# Patient Record
Sex: Female | Born: 1950 | Race: White | Hispanic: No | Marital: Single | Smoking: Never smoker
Health system: Southern US, Community
[De-identification: ages and names within clinical notes are randomized; demographics above are authoritative.]

## PROBLEM LIST (undated history)

## (undated) DIAGNOSIS — E669 Obesity, unspecified: Secondary | ICD-10-CM

## (undated) DIAGNOSIS — E1169 Type 2 diabetes mellitus with other specified complication: Secondary | ICD-10-CM

## (undated) DIAGNOSIS — I10 Essential (primary) hypertension: Secondary | ICD-10-CM

## (undated) HISTORY — DX: Essential (primary) hypertension: I10

## (undated) HISTORY — PX: BREAST BIOPSY: SHX20

## (undated) HISTORY — DX: Obesity, unspecified: E66.9

## (undated) HISTORY — PX: BREAST EXCISIONAL BIOPSY: SUR124

## (undated) HISTORY — DX: Type 2 diabetes mellitus with other specified complication: E11.69

## (undated) HISTORY — DX: Morbid (severe) obesity due to excess calories: E66.01

---

## 2019-02-18 ENCOUNTER — Ambulatory Visit: Payer: Self-pay

## 2019-02-23 ENCOUNTER — Ambulatory Visit: Payer: Self-pay

## 2019-02-25 ENCOUNTER — Encounter: Payer: Self-pay | Admitting: Physician Assistant

## 2019-02-25 ENCOUNTER — Other Ambulatory Visit: Payer: Self-pay | Admitting: Physician Assistant

## 2019-02-25 DIAGNOSIS — U071 COVID-19: Secondary | ICD-10-CM

## 2019-02-25 DIAGNOSIS — I10 Essential (primary) hypertension: Secondary | ICD-10-CM

## 2019-02-25 NOTE — Progress Notes (Signed)
  I connected by phone with Kathy Allen on 02/25/2019 at 4:46 PM to discuss the potential use of an new treatment for mild to moderate COVID-19 viral infection in non-hospitalized patients.  This patient is a 69 y.o. female that meets the FDA criteria for Emergency Use Authorization of bamlanivimab or casirivimab\imdevimab.  Has a (+) direct SARS-CoV-2 viral test result  Has mild or moderate COVID-19   Is ? 70 years of age and weighs ? 40 kg  Is NOT hospitalized due to COVID-19  Is NOT requiring oxygen therapy or requiring an increase in baseline oxygen flow rate due to COVID-19  Is within 10 days of symptom onset  Has at least one of the high risk factor(s) for progression to severe COVID-19 and/or hospitalization as defined in EUA.  Specific high risk criteria : >/= 70 yo   I have spoken and communicated the following to the patient or parent/caregiver:  1. FDA has authorized the emergency use of bamlanivimab and casirivimab\imdevimab for the treatment of mild to moderate COVID-19 in adults and pediatric patients with positive results of direct SARS-CoV-2 viral testing who are 47 years of age and older weighing at least 40 kg, and who are at high risk for progressing to severe COVID-19 and/or hospitalization.  2. The significant known and potential risks and benefits of bamlanivimab and casirivimab\imdevimab, and the extent to which such potential risks and benefits are unknown.  3. Information on available alternative treatments and the risks and benefits of those alternatives, including clinical trials.  4. Patients treated with bamlanivimab and casirivimab\imdevimab should continue to self-isolate and use infection control measures (e.g., wear mask, isolate, social distance, avoid sharing personal items, clean and disinfect "high touch" surfaces, and frequent handwashing) according to CDC guidelines.   5. The patient or parent/caregiver has the option to accept or refuse  bamlanivimab or casirivimab\imdevimab .  After reviewing this information with the patient, The patient agreed to proceed with receiving the casirivimab\imdevimab infusion and will be provided a copy of the Fact sheet prior to receiving the infusion.   Patient is set up for 02/26/19 @ 8:30am. Directions given. Sx onset 2/4. Was scheduled for vaccine tomorrow. She is aware that she needs to wait 90 days.   Angelena Form PA-C 02/25/2019 4:46 PM    2

## 2019-02-26 ENCOUNTER — Ambulatory Visit (HOSPITAL_COMMUNITY)
Admission: RE | Admit: 2019-02-26 | Discharge: 2019-02-26 | Disposition: A | Discharge status: Home / Self Care | Payer: Medicare Other | Source: Ambulatory Visit | Attending: Pulmonary Disease | Admitting: Pulmonary Disease

## 2019-02-26 ENCOUNTER — Ambulatory Visit: Payer: Self-pay

## 2019-02-26 DIAGNOSIS — U071 COVID-19: Secondary | ICD-10-CM | POA: Insufficient documentation

## 2019-02-26 DIAGNOSIS — Z23 Encounter for immunization: Secondary | ICD-10-CM | POA: Insufficient documentation

## 2019-02-26 DIAGNOSIS — I10 Essential (primary) hypertension: Secondary | ICD-10-CM | POA: Insufficient documentation

## 2019-02-26 MED ORDER — SODIUM CHLORIDE 0.9 % IV SOLN
Freq: Once | INTRAVENOUS | Status: AC
  Filled 2019-02-26: qty 10

## 2019-02-26 MED ORDER — EPINEPHRINE 0.3 MG/0.3ML IJ SOAJ: 0.3000 mg | Freq: Once | INTRAMUSCULAR | Status: DC | PRN

## 2019-02-26 MED ORDER — FAMOTIDINE IN NACL 20-0.9 MG/50ML-% IV SOLN: 20.0000 mg | Freq: Once | INTRAVENOUS | Status: DC | PRN

## 2019-02-26 MED ORDER — METHYLPREDNISOLONE SODIUM SUCC 125 MG IJ SOLR: 125.0000 mg | Freq: Once | INTRAMUSCULAR | Status: DC | PRN

## 2019-02-26 MED ORDER — ALBUTEROL SULFATE HFA 108 (90 BASE) MCG/ACT IN AERS: 2.0000 | INHALATION_SPRAY | Freq: Once | RESPIRATORY_TRACT | Status: DC | PRN

## 2019-02-26 MED ORDER — DIPHENHYDRAMINE HCL 50 MG/ML IJ SOLN: 50.0000 mg | Freq: Once | INTRAMUSCULAR | Status: DC | PRN

## 2019-02-26 MED ORDER — SODIUM CHLORIDE 0.9 % IV SOLN
INTRAVENOUS | Status: DC | PRN
  Administered 2019-02-26: 250 mL via INTRAVENOUS

## 2019-02-26 NOTE — Progress Notes (Signed)
  Diagnosis: COVID-19  Physician: Dr. Joya Gaskins  Procedure: [facility] Med: casirivimab\imdevimab infusion - Provided patient with casirivimab\imdevimab fact sheet for patients, parents and caregivers prior to infusion.  Complications: No immediate complications noted.  Discharge: Discharged home   Tai, Hermelinda Medicus 02/26/2019

## 2019-02-26 NOTE — Discharge Instructions (Signed)
COVID-19 COVID-19 is a respiratory infection that is caused by a virus called severe acute respiratory syndrome coronavirus 2 (SARS-CoV-2). The disease is also known as coronavirus disease or novel coronavirus. In some people, the virus may not cause any symptoms. In others, it may cause a serious infection. The infection can get worse quickly and can lead to complications, such as:  Pneumonia, or infection of the lungs.  Acute respiratory distress syndrome or ARDS. This is a condition in which fluid build-up in the lungs prevents the lungs from filling with air and passing oxygen into the blood.  Acute respiratory failure. This is a condition in which there is not enough oxygen passing from the lungs to the body or when carbon dioxide is not passing from the lungs out of the body.  Sepsis or septic shock. This is a serious bodily reaction to an infection.  Blood clotting problems.  Secondary infections due to bacteria or fungus.  Organ failure. This is when your body's organs stop working. The virus that causes COVID-19 is contagious. This means that it can spread from person to person through droplets from coughs and sneezes (respiratory secretions). What are the causes? This illness is caused by a virus. You may catch the virus by:  Breathing in droplets from an infected person. Droplets can be spread by a person breathing, speaking, singing, coughing, or sneezing.  Touching something, like a table or a doorknob, that was exposed to the virus (contaminated) and then touching your mouth, nose, or eyes. What increases the risk? Risk for infection You are more likely to be infected with this virus if you:  Are within 6 feet (2 meters) of a person with COVID-19.  Provide care for or live with a person who is infected with COVID-19.  Spend time in crowded indoor spaces or live in shared housing. Risk for serious illness You are more likely to become seriously ill from the virus if  you:  Are 50 years of age or older. The higher your age, the more you are at risk for serious illness.  Live in a nursing home or long-term care facility.  Have cancer.  Have a long-term (chronic) disease such as: ? Chronic lung disease, including chronic obstructive pulmonary disease or asthma. ? A long-term disease that lowers your body's ability to fight infection (immunocompromised). ? Heart disease, including heart failure, a condition in which the arteries that lead to the heart become narrow or blocked (coronary artery disease), a disease which makes the heart muscle thick, weak, or stiff (cardiomyopathy). ? Diabetes. ? Chronic kidney disease. ? Sickle cell disease, a condition in which red blood cells have an abnormal "sickle" shape. ? Liver disease.  Are obese. What are the signs or symptoms? Symptoms of this condition can range from mild to severe. Symptoms may appear any time from 2 to 14 days after being exposed to the virus. They include:  A fever or chills.  A cough.  Difficulty breathing.  Headaches, body aches, or muscle aches.  Runny or stuffy (congested) nose.  A sore throat.  New loss of taste or smell. Some people may also have stomach problems, such as nausea, vomiting, or diarrhea. Other people may not have any symptoms of COVID-19. How is this diagnosed? This condition may be diagnosed based on:  Your signs and symptoms, especially if: ? You live in an area with a COVID-19 outbreak. ? You recently traveled to or from an area where the virus is common. ? You   provide care for or live with a person who was diagnosed with COVID-19. ? You were exposed to a person who was diagnosed with COVID-19.  A physical exam.  Lab tests, which may include: ? Taking a sample of fluid from the back of your nose and throat (nasopharyngeal fluid), your nose, or your throat using a swab. ? A sample of mucus from your lungs (sputum). ? Blood tests.  Imaging tests,  which may include, X-rays, CT scan, or ultrasound. How is this treated? At present, there is no medicine to treat COVID-19. Medicines that treat other diseases are being used on a trial basis to see if they are effective against COVID-19. Your health care provider will talk with you about ways to treat your symptoms. For most people, the infection is mild and can be managed at home with rest, fluids, and over-the-counter medicines. Treatment for a serious infection usually takes places in a hospital intensive care unit (ICU). It may include one or more of the following treatments. These treatments are given until your symptoms improve.  Receiving fluids and medicines through an IV.  Supplemental oxygen. Extra oxygen is given through a tube in the nose, a face mask, or a hood.  Positioning you to lie on your stomach (prone position). This makes it easier for oxygen to get into the lungs.  Continuous positive airway pressure (CPAP) or bi-level positive airway pressure (BPAP) machine. This treatment uses mild air pressure to keep the airways open. A tube that is connected to a motor delivers oxygen to the body.  Ventilator. This treatment moves air into and out of the lungs by using a tube that is placed in your windpipe.  Tracheostomy. This is a procedure to create a hole in the neck so that a breathing tube can be inserted.  Extracorporeal membrane oxygenation (ECMO). This procedure gives the lungs a chance to recover by taking over the functions of the heart and lungs. It supplies oxygen to the body and removes carbon dioxide. Follow these instructions at home: Lifestyle  If you are sick, stay home except to get medical care. Your health care provider will tell you how long to stay home. Call your health care provider before you go for medical care.  Rest at home as told by your health care provider.  Do not use any products that contain nicotine or tobacco, such as cigarettes,  e-cigarettes, and chewing tobacco. If you need help quitting, ask your health care provider.  Return to your normal activities as told by your health care provider. Ask your health care provider what activities are safe for you. General instructions  Take over-the-counter and prescription medicines only as told by your health care provider.  Drink enough fluid to keep your urine pale yellow.  Keep all follow-up visits as told by your health care provider. This is important. How is this prevented?  There is no vaccine to help prevent COVID-19 infection. However, there are steps you can take to protect yourself and others from this virus. To protect yourself:   Do not travel to areas where COVID-19 is a risk. The areas where COVID-19 is reported change often. To identify high-risk areas and travel restrictions, check the CDC travel website: wwwnc.cdc.gov/travel/notices  If you live in, or must travel to, an area where COVID-19 is a risk, take precautions to avoid infection. ? Stay away from people who are sick. ? Wash your hands often with soap and water for 20 seconds. If soap and water   are not available, use an alcohol-based hand sanitizer. ? Avoid touching your mouth, face, eyes, or nose. ? Avoid going out in public, follow guidance from your state and local health authorities. ? If you must go out in public, wear a cloth face covering or face mask. Make sure your mask covers your nose and mouth. ? Avoid crowded indoor spaces. Stay at least 6 feet (2 meters) away from others. ? Disinfect objects and surfaces that are frequently touched every day. This may include:  Counters and tables.  Doorknobs and light switches.  Sinks and faucets.  Electronics, such as phones, remote controls, keyboards, computers, and tablets. To protect others: If you have symptoms of COVID-19, take steps to prevent the virus from spreading to others.  If you think you have a COVID-19 infection, contact  your health care provider right away. Tell your health care team that you think you may have a COVID-19 infection.  Stay home. Leave your house only to seek medical care. Do not use public transport.  Do not travel while you are sick.  Wash your hands often with soap and water for 20 seconds. If soap and water are not available, use alcohol-based hand sanitizer.  Stay away from other members of your household. Let healthy household members care for children and pets, if possible. If you have to care for children or pets, wash your hands often and wear a mask. If possible, stay in your own room, separate from others. Use a different bathroom.  Make sure that all people in your household wash their hands well and often.  Cough or sneeze into a tissue or your sleeve or elbow. Do not cough or sneeze into your hand or into the air.  Wear a cloth face covering or face mask. Make sure your mask covers your nose and mouth. Where to find more information  Centers for Disease Control and Prevention: www.cdc.gov/coronavirus/2019-ncov/index.html  World Health Organization: www.who.int/health-topics/coronavirus Contact a health care provider if:  You live in or have traveled to an area where COVID-19 is a risk and you have symptoms of the infection.  You have had contact with someone who has COVID-19 and you have symptoms of the infection. Get help right away if:  You have trouble breathing.  You have pain or pressure in your chest.  You have confusion.  You have bluish lips and fingernails.  You have difficulty waking from sleep.  You have symptoms that get worse. These symptoms may represent a serious problem that is an emergency. Do not wait to see if the symptoms will go away. Get medical help right away. Call your local emergency services (911 in the [place] Do not drive yourself to the hospital. Let the emergency medical personnel know if you think you have  COVID-19. Summary  COVID-19 is a respiratory infection that is caused by a virus. It is also known as coronavirus disease or novel coronavirus. It can cause serious infections, such as pneumonia, acute respiratory distress syndrome, acute respiratory failure, or sepsis.  The virus that causes COVID-19 is contagious. This means that it can spread from person to person through droplets from breathing, speaking, singing, coughing, or sneezing.  You are more likely to develop a serious illness if you are 50 years of age or older, have a weak immune system, live in a nursing home, or have chronic disease.  There is no medicine to treat COVID-19. Your health care provider will talk with you about ways to treat your symptoms.    Take steps to protect yourself and others from infection. Wash your hands often and disinfect objects and surfaces that are frequently touched every day. Stay away from people who are sick and wear a mask if you are sick. This information is not intended to replace advice given to you by your health care provider. Make sure you discuss any questions you have with your health care provider. Document Revised: 11/05/2018 Document Reviewed: 02/11/2018 Elsevier Patient Education  2020 Elsevier Inc. What types of side effects do monoclonal antibody drugs cause?  Common side effects  In general, the more common side effects caused by monoclonal antibody drugs include: . Allergic reactions, such as hives or itching . Flu-like signs and symptoms, including chills, fatigue, fever, and muscle aches and pains . Nausea, vomiting . Diarrhea . Skin rashes . Low blood pressure   The CDC is recommending patients who receive monoclonal antibody treatments wait at least 90 days before being vaccinated.  Currently, there are no data on the safety and efficacy of mRNA COVID-19 vaccines in persons who received monoclonal antibodies or convalescent plasma as part of COVID-19 treatment. Based  on the estimated half-life of such therapies as well as evidence suggesting that reinfection is uncommon in the 90 days after initial infection, vaccination should be deferred for at least 90 days, as a precautionary measure until additional information becomes available, to avoid interference of the antibody treatment with vaccine-induced immune responses. 

## 2019-05-17 ENCOUNTER — Encounter: Payer: Self-pay | Admitting: Cardiovascular Disease

## 2019-05-24 ENCOUNTER — Ambulatory Visit (INDEPENDENT_AMBULATORY_CARE_PROVIDER_SITE_OTHER): Payer: Medicare Other | Admitting: Cardiovascular Disease

## 2019-05-24 ENCOUNTER — Encounter: Payer: Self-pay | Admitting: Cardiovascular Disease

## 2019-05-24 ENCOUNTER — Other Ambulatory Visit: Payer: Self-pay

## 2019-05-24 VITALS — BP 124/78 | HR 76 | Ht 64.0 in | Wt 202.0 lb

## 2019-05-24 DIAGNOSIS — I10 Essential (primary) hypertension: Secondary | ICD-10-CM

## 2019-05-24 DIAGNOSIS — E782 Mixed hyperlipidemia: Secondary | ICD-10-CM | POA: Diagnosis not present

## 2019-05-24 DIAGNOSIS — R5383 Other fatigue: Secondary | ICD-10-CM | POA: Diagnosis not present

## 2019-05-24 DIAGNOSIS — E785 Hyperlipidemia, unspecified: Secondary | ICD-10-CM | POA: Insufficient documentation

## 2019-05-24 NOTE — Progress Notes (Signed)
05/24/2019 Kathy Allen   05-Aug-1950  [id number]  Primary Physician Glenis Smoker, MD Primary Cardiologist: Lorretta Harp MD FACP, [place], [place], [place]  HPI:  Kathy Allen is a 69 y.o. moderately overweight single Caucasian female with no children referred by Dr. Lindell Noe because of fatigue.  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  Her cardiovascular risk factor profile is notable for treated hypertension hyperlipidemia.  There is no family history for heart disease.  She is never had a heart attack or stroke.  She denies chest pain or shortness of breath.  She did have COVID-19 back in early February of this year and had Regeneron infusion a day after the diagnosis.  Since that time she is felt intermittently fatigued for no reason.   Current Meds  Medication Sig  . amitriptyline (ELAVIL) 10 MG tablet Take 3 tablets by mouth daily.  . famotidine (PEPCID) 10 MG tablet Take 10 mg by mouth daily.  . metFORMIN (GLUCOPHAGE) 500 MG tablet Take 1,000 mg by mouth 2 (two) times daily with a meal.  . metoprolol succinate (TOPROL-XL) 50 MG 24 hr tablet Take 50 mg by mouth daily.  . simvastatin (ZOCOR) 20 MG tablet Take 20 mg by mouth daily.  Marland Kitchen topiramate (TOPAMAX) 50 MG tablet Take 50 mg by mouth 2 (two) times daily.     Allergies  Allergen Reactions  . Sulfur   . Tagamet [Cimetidine]     Social History   Socioeconomic History  . Marital status: Unknown    Spouse name: Not on file  . Number of children: Not on file  . Years of education: Not on file  . Highest education level: Not on file  Occupational History  . Not on file  Tobacco Use  . Smoking status: Never Smoker  . Smokeless tobacco: Never Used  Substance and Sexual Activity  . Alcohol use: Not on file  . Drug use: Not on file  . Sexual activity: Not on file  Other  Topics Concern  . Not on file  Social History Narrative  . Not on file   Social Determinants of Health   Financial Resource Strain:   . Difficulty of Paying Living Expenses:   Food Insecurity:   . Worried About Charity fundraiser in the Last Year:   . Arboriculturist in the Last Year:   Transportation Needs:   . Film/video editor (Medical):   Marland Kitchen Lack of Transportation (Non-Medical):   Physical Activity:   . Days of Exercise per Week:   . Minutes of Exercise per Session:   Stress:   . Feeling of Stress :   Social Connections:   . Frequency of Communication with Friends and Family:   . Frequency of Social Gatherings with Friends and Family:   . Attends Religious Services:   . Active Member of Clubs or Organizations:   . Attends Archivist Meetings:   Marland Kitchen Marital Status:   Intimate Partner Violence:   . Fear of Current or Ex-Partner:   . Emotionally Abused:   Marland Kitchen Physically Abused:   . Sexually Abused:      Review of Systems: General: negative for chills, fever, night sweats or weight changes.  Cardiovascular: negative for chest pain, dyspnea on exertion, edema, orthopnea, palpitations, paroxysmal nocturnal dyspnea or shortness of breath Dermatological: negative for rash Respiratory:  negative for cough or wheezing Urologic: negative for hematuria Abdominal: negative for nausea, vomiting, diarrhea, bright red blood per rectum, melena, or hematemesis Neurologic: negative for visual changes, syncope, or dizziness All other systems reviewed and are otherwise negative except as noted above.    Blood pressure 124/78, pulse 76, height 5\' 4"  (1.626 m), weight 202 lb (91.6 kg), SpO2 96 %.  General appearance: alert and no distress Neck: no adenopathy, no carotid bruit, no JVD, supple, symmetrical, trachea midline and thyroid not enlarged, symmetric, no tenderness/mass/nodules Lungs: clear to auscultation bilaterally Heart: regular rate and rhythm, S1, S2 normal, no  murmur, click, rub or gallop Extremities: extremities normal, atraumatic, no cyanosis or edema Pulses: 2+ and symmetric Skin: Skin color, texture, turgor normal. No rashes or lesions Neurologic: Alert and oriented X 3, normal strength and tone. Normal symmetric reflexes. Normal coordination and gait  EKG not performed today  ASSESSMENT AND PLAN:   Hyperlipidemia History of hyperlipidemia on statin therapy with lipid profile performed by her PCP 03/14/2019 revealing a total cholesterol 164, LDL of 88 and HDL of 60.  Essential hypertension History of essential potential blood pressure measured today of 124/78.  She is on metoprolol.      Lorretta Harp MD FACP,FACC,FAHA, [facility] 05/24/2019 2:51 PM

## 2019-05-24 NOTE — Assessment & Plan Note (Signed)
History of hyperlipidemia on statin therapy with lipid profile performed by her PCP 03/14/2019 revealing a total cholesterol 164, LDL of 88 and HDL of 60.

## 2019-05-24 NOTE — Patient Instructions (Signed)
Medication Instructions:  NO CHANGE *If you need a refill on your cardiac medications before your next appointment, please call your pharmacy*   Lab Work: If you have labs (blood work) drawn today and your tests are completely normal, you will receive your results only by: . MyChart Message (if you have MyChart) OR . A paper copy in the mail If you have any lab test that is abnormal or we need to change your treatment, we will call you to review the results.   Testing/Procedures: Your physician has requested that you have an echocardiogram. Echocardiography is a painless test that uses sound waves to create images of your heart. It provides your doctor with information about the size and shape of your heart and how well your heart's chambers and valves are working. This procedure takes approximately one hour. There are no restrictions for this procedure.[address]   Follow-Up: At [facility], you and your health needs are our priority.  As part of our continuing mission to provide you with exceptional heart care, we have created designated Provider Care Teams.  These Care Teams include your primary Cardiologist (physician) and Advanced Practice Providers (APPs -  Physician Assistants and Nurse Practitioners) who all work together to provide you with the care you need, when you need it.  We recommend signing up for the patient portal called "MyChart".  Sign up information is provided on this After Visit Summary.  MyChart is used to connect with patients for Virtual Visits (Telemedicine).  Patients are able to view lab/test results, encounter notes, upcoming appointments, etc.  Non-urgent messages can be sent to your provider as well.   To learn more about what you can do with MyChart, go to [email or web address].    Your next appointment:    AS NEEDED 

## 2019-05-24 NOTE — Assessment & Plan Note (Signed)
History of essential potential blood pressure measured today of 124/78.  She is on metoprolol.

## 2019-05-27 ENCOUNTER — Ambulatory Visit (HOSPITAL_COMMUNITY): Payer: Medicare Other | Attending: Cardiology

## 2019-05-27 ENCOUNTER — Other Ambulatory Visit: Payer: Self-pay

## 2019-05-27 DIAGNOSIS — R5383 Other fatigue: Secondary | ICD-10-CM

## 2019-05-27 DIAGNOSIS — I080 Rheumatic disorders of both mitral and aortic valves: Secondary | ICD-10-CM | POA: Insufficient documentation

## 2019-05-27 DIAGNOSIS — Z8616 Personal history of COVID-19: Secondary | ICD-10-CM | POA: Diagnosis not present

## 2019-05-27 DIAGNOSIS — I119 Hypertensive heart disease without heart failure: Secondary | ICD-10-CM | POA: Insufficient documentation

## 2019-05-27 DIAGNOSIS — I10 Essential (primary) hypertension: Secondary | ICD-10-CM | POA: Diagnosis not present

## 2019-05-27 DIAGNOSIS — E785 Hyperlipidemia, unspecified: Secondary | ICD-10-CM | POA: Insufficient documentation

## 2019-05-30 ENCOUNTER — Encounter: Payer: Self-pay | Admitting: Physician Assistant

## 2020-02-23 ENCOUNTER — Other Ambulatory Visit: Payer: Self-pay | Admitting: Family Medicine

## 2020-02-23 DIAGNOSIS — E2839 Other primary ovarian failure: Secondary | ICD-10-CM

## 2020-02-23 DIAGNOSIS — Z1231 Encounter for screening mammogram for malignant neoplasm of breast: Secondary | ICD-10-CM

## 2020-03-06 ENCOUNTER — Ambulatory Visit
Admission: RE | Admit: 2020-03-06 | Discharge: 2020-03-06 | Disposition: A | Discharge status: Home / Self Care | Payer: Medicare Other | Source: Ambulatory Visit | Attending: Family Medicine | Admitting: Family Medicine

## 2020-03-06 ENCOUNTER — Other Ambulatory Visit: Payer: Self-pay

## 2020-03-06 DIAGNOSIS — Z1231 Encounter for screening mammogram for malignant neoplasm of breast: Secondary | ICD-10-CM

## 2020-07-03 ENCOUNTER — Other Ambulatory Visit: Payer: Self-pay

## 2020-07-03 ENCOUNTER — Ambulatory Visit
Admission: RE | Admit: 2020-07-03 | Discharge: 2020-07-03 | Disposition: A | Discharge status: Home / Self Care | Payer: Medicare Other | Source: Ambulatory Visit | Attending: Family Medicine | Admitting: Family Medicine

## 2020-07-03 DIAGNOSIS — E2839 Other primary ovarian failure: Secondary | ICD-10-CM

## 2021-02-22 IMAGING — MG MM DIGITAL SCREENING BILAT W/ TOMO AND CAD
8 series · 8 of 24 positions shown · non-contrast
Comparison: Previous exam(s).

CLINICAL DATA: Screening.

EXAM:
DIGITAL SCREENING BILATERAL MAMMOGRAM WITH TOMOSYNTHESIS AND CAD
TECHNIQUE: Bilateral screening digital craniocaudal and mediolateral oblique
mammograms were obtained. Bilateral screening digital breast
tomosynthesis was performed. The images were evaluated with
computer-aided detection.

[L MLO synth-2D]
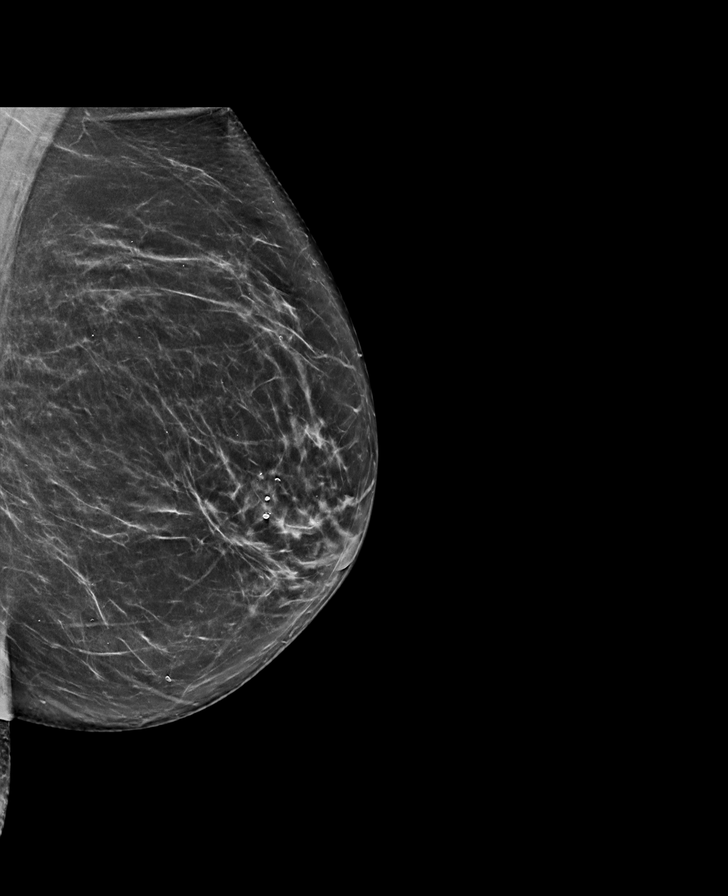

[R CC synth-2D]
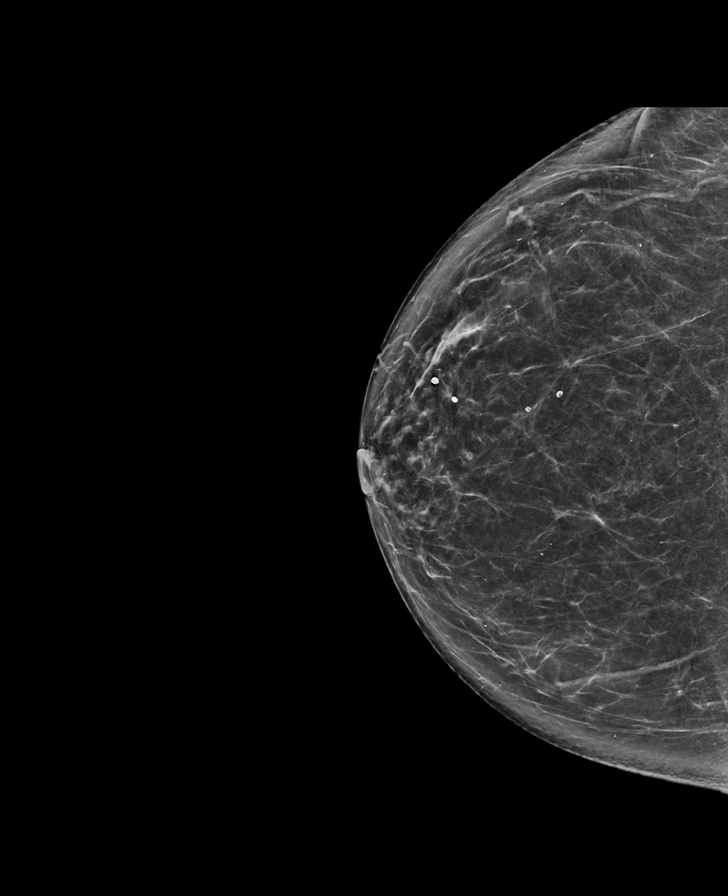

[R MLO synth-2D]
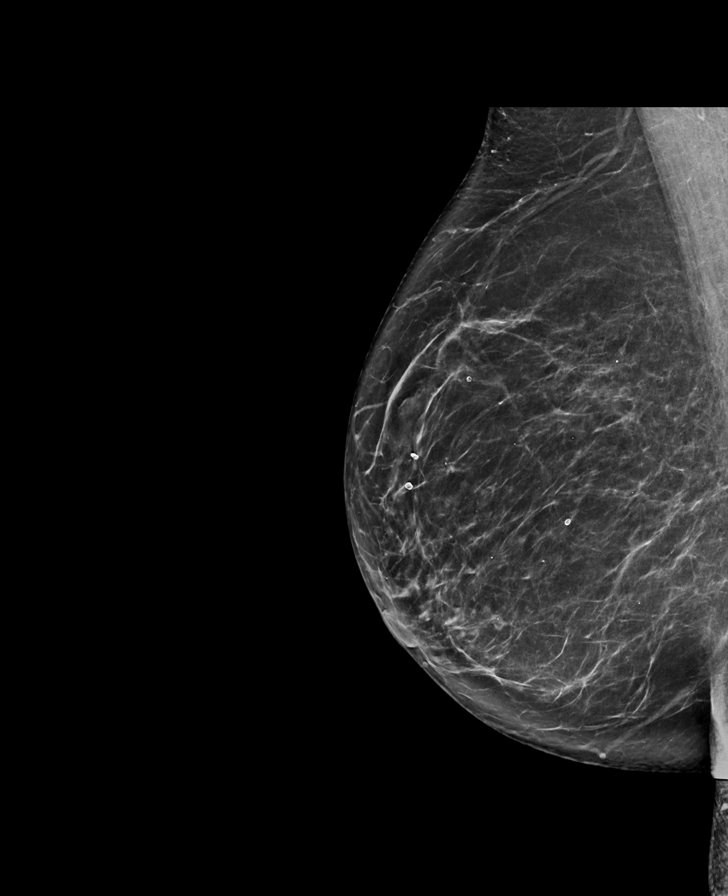

[L CC synth-2D]
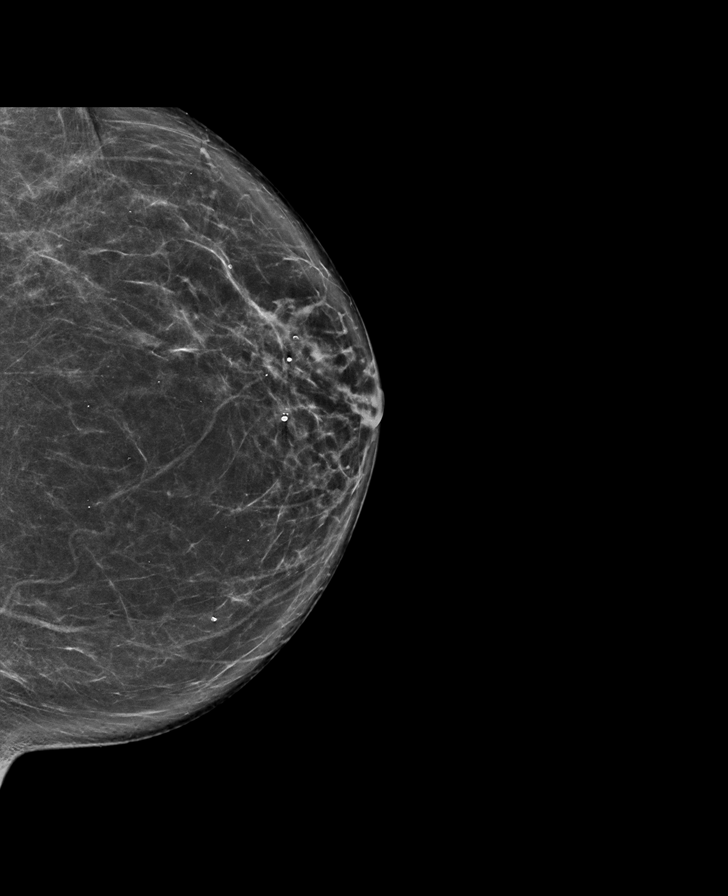

[L MLO tomo · tomo slice 40/79.0]
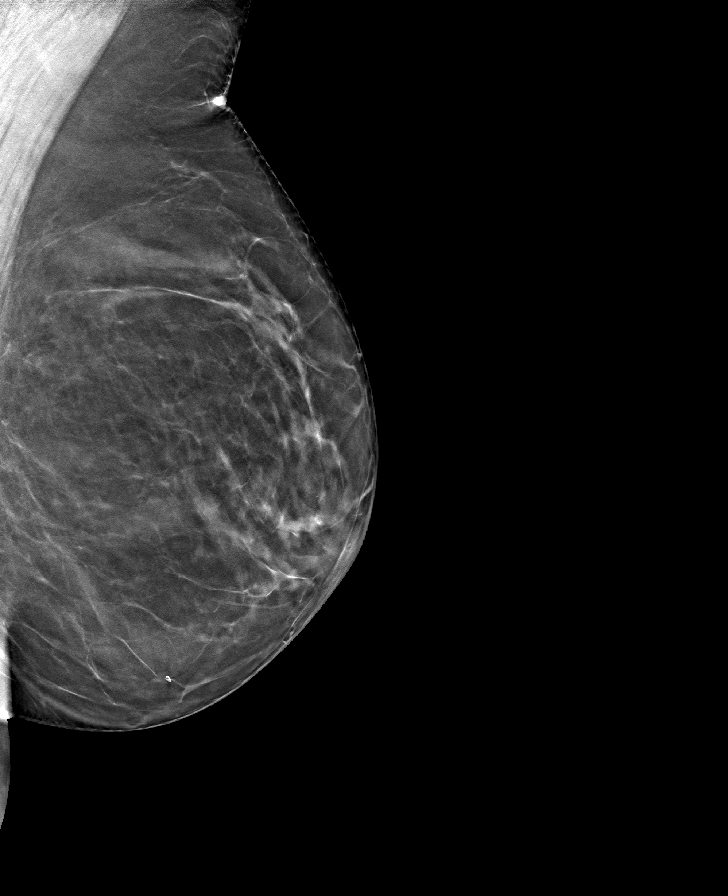

[R MLO tomo · tomo slice 41/82.0]
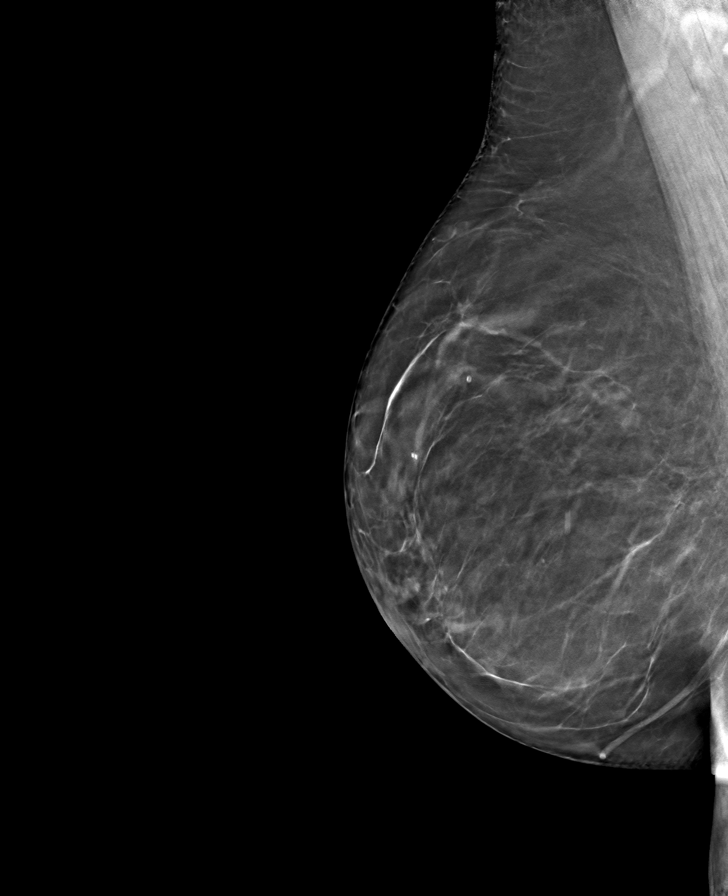

[L CC tomo · tomo slice 39/78.0]
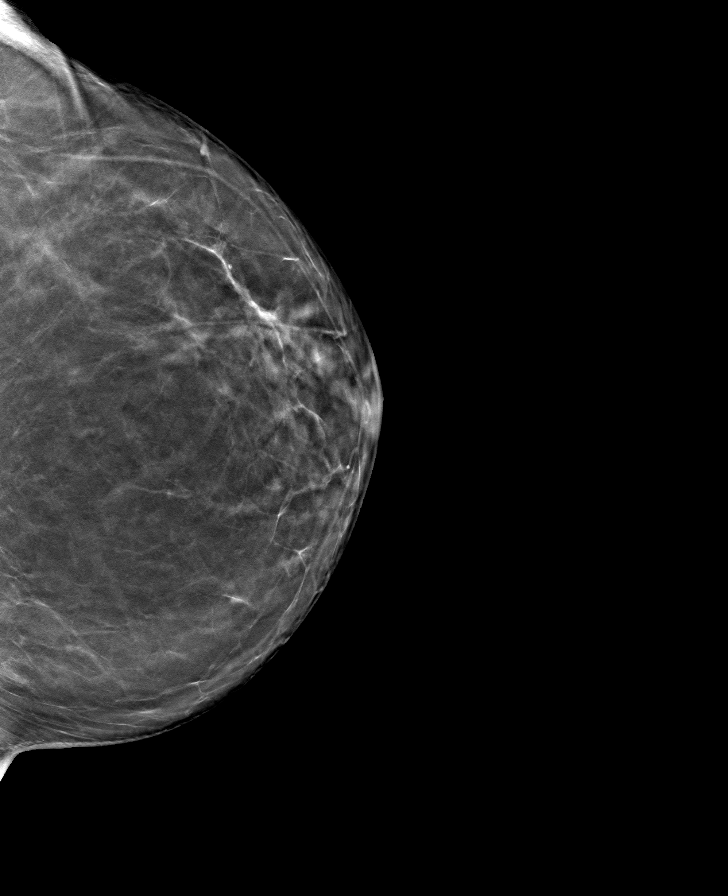

[R CC tomo · tomo slice 40/79.0]
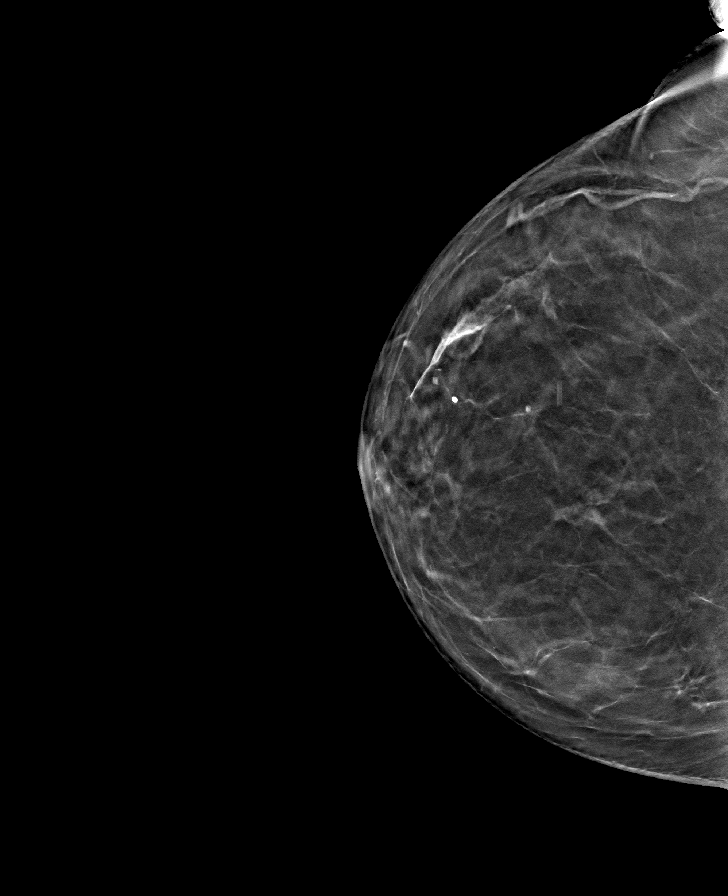

[8 of 24 positions shown; findings below may reference images not displayed]

ACR Breast Density Category b: There are scattered areas of
fibroglandular density.
FINDINGS: There are no findings suspicious for malignancy.
IMPRESSION: No mammographic evidence of malignancy. A result letter of this
screening mammogram will be mailed directly to the patient.

RECOMMENDATION:
Screening mammogram in one year. (Code:51-O-LD2)

BI-RADS CATEGORY  1: Negative.

## 2021-09-11 ENCOUNTER — Ambulatory Visit (INDEPENDENT_AMBULATORY_CARE_PROVIDER_SITE_OTHER): Payer: Medicare Other | Admitting: Podiatry

## 2021-09-11 ENCOUNTER — Encounter: Payer: Self-pay | Admitting: Podiatry

## 2021-09-11 DIAGNOSIS — Q828 Other specified congenital malformations of skin: Secondary | ICD-10-CM

## 2021-09-11 DIAGNOSIS — D2371 Other benign neoplasm of skin of right lower limb, including hip: Secondary | ICD-10-CM

## 2021-09-11 NOTE — Progress Notes (Signed)
  Subjective:  Patient ID: Kathy Allen, female    DOB: [birth year],   MRN: [id number]  Chief Complaint  Patient presents with   Callouses    Right foot callus , painful ongoing for 1 month ,patient states pain is constant when standing. Patient is also concerned and wants to talk about bunion and hammertoe     71 y.o. female presents for concern of right foot callus that has been ongoing for about a month and painful when walking on it. Also has some concern for her bunions and hammertoes and would like advised on them as well.  . Denies any other pedal complaints. Denies n/v/f/c.   Past Medical History:  Diagnosis Date   Diabetes mellitus type 2 in obese (HCC)    HTN (hypertension)    Morbid obesity (HCC)     Objective:  Physical Exam: Vascular: DP/PT pulses 2/4 bilateral. CFT <3 seconds. Normal hair growth on digits. No edema.  Skin. No lacerations or abrasions bilateral feet. Hyperkeratotic cored lesion noted to lateral plantar right foot with cored lesion.  Musculoskeletal: MMT 5/5 bilateral lower extremities in DF, PF, Inversion and Eversion. Deceased ROM in DF of ankle joint.  Neurological: Sensation intact to light touch.   Assessment:   1. Porokeratosis      Plan:  Patient was evaluated and treated and all questions answered. -Discussed corns and calluses with patient and treatment options.  -Hyperkeratotic tissue was debrided with chisel without incident.  -Applied salycylic acid treatment to area with dressing. Advised to remove bandaging tomorrow.  -Encouraged daily moisturizing -Discussed use of pumice stone -Advised good supportive shoes and inserts -Patient to return to office as needed or sooner if condition worsens.   Lorenda Peck, DPM

## 2022-01-21 ENCOUNTER — Encounter: Payer: Self-pay | Admitting: Podiatry

## 2022-01-21 ENCOUNTER — Ambulatory Visit (INDEPENDENT_AMBULATORY_CARE_PROVIDER_SITE_OTHER): Payer: Medicare Other | Admitting: Podiatry

## 2022-01-21 DIAGNOSIS — Q828 Other specified congenital malformations of skin: Secondary | ICD-10-CM

## 2022-01-21 NOTE — Progress Notes (Signed)
  Subjective:  Patient ID: Kathy Allen, female    DOB: 1950/06/03,   MRN: [id number]  Chief Complaint  Patient presents with   Callouses    Rm 22 Right foot callus debridement. Pt states callus is getting larger and more sensitive.     72 y.o. female presents for followe-up of right foot callus. Relates it is getting bigger and needs trimmed. Relates trimming did help the last time . Denies any other pedal complaints. Denies n/v/f/c.   Past Medical History:  Diagnosis Date   Diabetes mellitus type 2 in obese (HCC)    HTN (hypertension)    Morbid obesity (HCC)     Objective:  Physical Exam: Vascular: DP/PT pulses 2/4 bilateral. CFT <3 seconds. Normal hair growth on digits. No edema.  Skin. No lacerations or abrasions bilateral feet. Hyperkeratotic cored lesion noted to lateral plantar right foot with cored lesion.  Musculoskeletal: MMT 5/5 bilateral lower extremities in DF, PF, Inversion and Eversion. Deceased ROM in DF of ankle joint.  Neurological: Sensation intact to light touch.   Assessment:   1. Porokeratosis       Plan:  Patient was evaluated and treated and all questions answered. -Discussed corns and calluses with patient and treatment options.  -Hyperkeratotic tissue was debrided with chisel without incident.  -Applied salycylic acid treatment to area with dressing. Advised to remove bandaging tomorrow.  -Encouraged daily moisturizing -Discussed use of pumice stone -Advised good supportive shoes and inserts -Patient to return to office as needed or sooner if condition worsens.   Lorenda Peck, DPM

## 2022-04-02 ENCOUNTER — Other Ambulatory Visit: Payer: Self-pay | Admitting: Family Medicine

## 2022-04-02 DIAGNOSIS — E2839 Other primary ovarian failure: Secondary | ICD-10-CM

## 2022-05-02 ENCOUNTER — Ambulatory Visit (INDEPENDENT_AMBULATORY_CARE_PROVIDER_SITE_OTHER): Payer: Medicare Other | Admitting: Podiatry

## 2022-05-02 ENCOUNTER — Encounter: Payer: Self-pay | Admitting: Podiatry

## 2022-05-02 DIAGNOSIS — Q828 Other specified congenital malformations of skin: Secondary | ICD-10-CM | POA: Diagnosis not present

## 2022-05-02 NOTE — Progress Notes (Signed)
  Subjective:  Patient ID: Kathy Allen, female    DOB: August 17, 1950,   MRN: [id number]  Chief Complaint  Patient presents with   Callouses     Callus trim     72 y.o. female presents for follow-up of right foot callus. Relates she got some new spots this time that need trimming. Relates trimming did help the last time . Denies any other pedal complaints. Denies n/v/f/c.   Past Medical History:  Diagnosis Date   Diabetes mellitus type 2 in obese    HTN (hypertension)    Morbid obesity     Objective:  Physical Exam: Vascular: DP/PT pulses 2/4 bilateral. CFT <3 seconds. Normal hair growth on digits. No edema.  Skin. No lacerations or abrasions bilateral feet. Two Hyperkeratotic cored lesion noted to lateral plantar right foot with cored lesion.  Musculoskeletal: MMT 5/5 bilateral lower extremities in DF, PF, Inversion and Eversion. Deceased ROM in DF of ankle joint.  Neurological: Sensation intact to light touch.   Assessment:   1. Porokeratosis        Plan:  Patient was evaluated and treated and all questions answered. -Discussed corns and calluses with patient and treatment options.  -Hyperkeratotic tissue was debrided with chisel without incident.  -Applied salycylic acid treatment to area with dressing. Advised to remove bandaging tomorrow.  -Encouraged daily moisturizing -Discussed use of pumice stone -Advised good supportive shoes and inserts -Patient to return to office as needed or sooner if condition worsens.   Louann Sjogren, DPM

## 2022-08-01 ENCOUNTER — Encounter: Payer: Self-pay | Admitting: Podiatry

## 2022-08-01 ENCOUNTER — Ambulatory Visit (INDEPENDENT_AMBULATORY_CARE_PROVIDER_SITE_OTHER): Payer: Medicare Other | Admitting: Podiatry

## 2022-08-01 DIAGNOSIS — Q828 Other specified congenital malformations of skin: Secondary | ICD-10-CM

## 2022-08-01 DIAGNOSIS — L600 Ingrowing nail: Secondary | ICD-10-CM

## 2022-08-01 NOTE — Progress Notes (Signed)
  Subjective:  Patient ID: Kathy Allen, female    DOB: Jan 30, 1950,   MRN: [id number]  Chief Complaint  Patient presents with   Callouses    Right foot callus    Nail Problem    Right great toe possible ingrown / fungus     72 y.o. female presents for follow-up of right foot callus. Relates concern for possible ingrown nail. Relates trimming did help the last time . Denies any other pedal complaints. Denies n/v/f/c.   Past Medical History:  Diagnosis Date   Diabetes mellitus type 2 in obese    HTN (hypertension)    Morbid obesity (HCC)     Objective:  Physical Exam: Vascular: DP/PT pulses 2/4 bilateral. CFT <3 seconds. Normal hair growth on digits. No edema.  Skin. No lacerations or abrasions bilateral feet. Two Hyperkeratotic cored lesion noted to lateral plantar right foot with cored lesion.  Incruvation mildy to bilateral borders of right great toenail.  Musculoskeletal: MMT 5/5 bilateral lower extremities in DF, PF, Inversion and Eversion. Deceased ROM in DF of ankle joint.  Neurological: Sensation intact to light touch.   Assessment:   1. Porokeratosis   2. Ingrown right greater toenail         Plan:  Patient was evaluated and treated and all questions answered. -Discussed corns and calluses with patient and treatment options.  -Hyperkeratotic tissue was debrided with chisel without incident.  -Applied salycylic acid treatment to area with dressing. Advised to remove bandaging tomorrow.  -Encouraged daily moisturizing -Discussed use of pumice stone -Advised good supportive shoes and inserts Discussed ingrown toenails etiology and treatment options including procedure for removal vs conservative care.  Patient requesting trimming of nail today and done as courtesy.  Return as needed for possible ingrown nail procedure.      -Patient to return to office as needed or sooner if condition worsens.   Louann Sjogren, DPM

## 2022-10-01 ENCOUNTER — Ambulatory Visit
Admission: RE | Admit: 2022-10-01 | Discharge: 2022-10-01 | Disposition: A | Discharge status: Home / Self Care | Payer: Medicare Other | Source: Ambulatory Visit | Attending: Family Medicine

## 2022-10-01 DIAGNOSIS — E2839 Other primary ovarian failure: Secondary | ICD-10-CM

## 2022-10-17 ENCOUNTER — Ambulatory Visit (INDEPENDENT_AMBULATORY_CARE_PROVIDER_SITE_OTHER): Payer: Medicare Other | Admitting: Podiatry

## 2022-10-17 ENCOUNTER — Encounter: Payer: Self-pay | Admitting: Podiatry

## 2022-10-17 DIAGNOSIS — L600 Ingrowing nail: Secondary | ICD-10-CM | POA: Diagnosis not present

## 2022-10-17 NOTE — Progress Notes (Signed)
  Subjective:  Patient ID: Kathy Allen, female    DOB: July 12, 1950,   MRN: [id number]  No chief complaint on file.   72 y.o. female presents for follow-up ingrown toenail on the right great toe. Relates she is ready to try the procedure for the nail .  Marland Kitchen Denies any other pedal complaints. Denies n/v/f/c.   Past Medical History:  Diagnosis Date   Diabetes mellitus type 2 in obese    HTN (hypertension)    Morbid obesity (HCC)     Objective:  Physical Exam: Vascular: DP/PT pulses 2/4 bilateral. CFT <3 seconds. Normal hair growth on digits. No edema.  Skin. No lacerations or abrasions bilateral feet. Two Hyperkeratotic cored lesion noted to lateral plantar right foot with cored lesion.  Incruvation mildy to bilateral borders of right great toenail.  Musculoskeletal: MMT 5/5 bilateral lower extremities in DF, PF, Inversion and Eversion. Deceased ROM in DF of ankle joint.  Neurological: Sensation intact to light touch.   Assessment:   1. Ingrown right greater toenail          Plan:  Patient was evaluated and treated and all questions answered. Plantar foot lesions doing well today.  Discussed ingrown toenails etiology and treatment options including procedure for removal vs conservative care.  Patient requesting removal of ingrown nail today. Procedure below.  Discussed procedure and post procedure care and patient expressed understanding.  Will follow-up in 2 weeks for nail check or sooner if any problems arise.    Procedure:  Procedure: partial Nail Avulsion of right hallux bilateral nail border.  Surgeon: Louann Sjogren, DPM  Pre-op Dx: Ingrown toenail without infection Post-op: Same  Place of Surgery: Office exam room.  Indications for surgery: Painful and ingrown toenail.    The patient is requesting removal of nail with chemical matrixectomy. Risks and complications were discussed with the patient for which they understand and written consent was obtained. Under  sterile conditions a total of 3 mL of  1% lidocaine plain was infiltrated in a hallux block fashion. Once anesthetized, the skin was prepped in sterile fashion. A tourniquet was then applied. Next the bilateral aspect of hallux nail border was then sharply excised making sure to remove the entire offending nail border.  Next phenol was then applied under standard conditions and copiously irrigated. Silvadene was applied. A dry sterile dressing was applied. After application of the dressing the tourniquet was removed and there is found to be an immediate capillary refill time to the digit. The patient tolerated the procedure well without any complications. Post procedure instructions were discussed the patient for which he verbally understood. Follow-up in two weeks for nail check or sooner if any problems are to arise. Discussed signs/symptoms of infection and directed to call the office immediately should any occur or go directly to the emergency room. In the meantime, encouraged to call the office with any questions, concerns, changes symptoms.   Louann Sjogren, DPM

## 2022-10-17 NOTE — Patient Instructions (Signed)

## 2023-03-20 ENCOUNTER — Ambulatory Visit (INDEPENDENT_AMBULATORY_CARE_PROVIDER_SITE_OTHER): Payer: Medicare Other | Admitting: Podiatry

## 2023-03-20 DIAGNOSIS — Q828 Other specified congenital malformations of skin: Secondary | ICD-10-CM

## 2023-03-20 NOTE — Progress Notes (Signed)
  Subjective:  Patient ID: Kathy Allen, female    DOB: 1950-02-02,   MRN: [id number]  Chief Complaint  Patient presents with   Callouses    Pt presents for a painful callouses on both feet states it hurts when she walks.    73 y.o. female presents for follow-up of right foot callus. Relates has been doing well but have started to bother her again. Has been doing a lot of pickleball. Relates trimming did help the last time . Denies any other pedal complaints. Denies n/v/f/c.   Past Medical History:  Diagnosis Date   Diabetes mellitus type 2 in obese    HTN (hypertension)    Morbid obesity (HCC)     Objective:  Physical Exam: Vascular: DP/PT pulses 2/4 bilateral. CFT <3 seconds. Normal hair growth on digits. No edema.  Skin. No lacerations or abrasions bilateral feet. Two Hyperkeratotic cored lesion noted to lateral plantar right foot with cored lesion.  Right hallux nail well healed.  Musculoskeletal: MMT 5/5 bilateral lower extremities in DF, PF, Inversion and Eversion. Deceased ROM in DF of ankle joint.  Neurological: Sensation intact to light touch.   Assessment:   1. Porokeratosis          Plan:  Patient was evaluated and treated and all questions answered. -Discussed corns and calluses with patient and treatment options.  -Hyperkeratotic tissue was debrided with chisel without incident.  -Applied salycylic acid treatment to area with dressing. Advised to remove bandaging tomorrow.  -Encouraged daily moisturizing -Discussed use of pumice stone -Advised good supportive shoes and inserts -Patient to return to office as needed or sooner if condition worsens.   Louann Sjogren, DPM

## 2023-05-15 ENCOUNTER — Other Ambulatory Visit: Payer: Self-pay | Admitting: Family Medicine

## 2023-05-15 DIAGNOSIS — Z1231 Encounter for screening mammogram for malignant neoplasm of breast: Secondary | ICD-10-CM

## 2023-05-28 ENCOUNTER — Ambulatory Visit
Admission: RE | Admit: 2023-05-28 | Discharge: 2023-05-28 | Disposition: A | Discharge status: Home / Self Care | Source: Ambulatory Visit | Attending: Family Medicine | Admitting: Family Medicine

## 2023-05-28 DIAGNOSIS — Z1231 Encounter for screening mammogram for malignant neoplasm of breast: Secondary | ICD-10-CM

## 2023-05-29 ENCOUNTER — Ambulatory Visit (INDEPENDENT_AMBULATORY_CARE_PROVIDER_SITE_OTHER): Admitting: Podiatry

## 2023-05-29 ENCOUNTER — Encounter: Payer: Self-pay | Admitting: Podiatry

## 2023-05-29 DIAGNOSIS — Q828 Other specified congenital malformations of skin: Secondary | ICD-10-CM

## 2023-05-29 NOTE — Progress Notes (Signed)
  Subjective:  Patient ID: Kathy Allen, female    DOB: 11/10/1950,   MRN: [id number]  No chief complaint on file.   73 y.o. female presents for follow-up of right foot callus. Relates has been doing well but have started to bother her again. Has been doing a lot of pickleball. Relates trimming did help the last time . Denies any other pedal complaints. Denies n/v/f/c.   Past Medical History:  Diagnosis Date   Diabetes mellitus type 2 in obese    HTN (hypertension)    Morbid obesity (HCC)     Objective:  Physical Exam: Vascular: DP/PT pulses 2/4 bilateral. CFT <3 seconds. Normal hair growth on digits. No edema.  Skin. No lacerations or abrasions bilateral feet. Two Hyperkeratotic cored lesion noted to lateral plantar right foot with cored lesion.  Right hallux nail well healed.  Musculoskeletal: MMT 5/5 bilateral lower extremities in DF, PF, Inversion and Eversion. Deceased ROM in DF of ankle joint.  Neurological: Sensation intact to light touch.   Assessment:   1. Porokeratosis          Plan:  Patient was evaluated and treated and all questions answered. -Discussed corns and calluses with patient and treatment options.  -Hyperkeratotic tissue was debrided with chisel without incident.  -Applied salycylic acid treatment to area with dressing. Advised to remove bandaging tomorrow.  -Encouraged daily moisturizing -Discussed use of pumice stone -Advised good supportive shoes and inserts -Patient to return to office as needed or sooner if condition worsens.   Jennefer Moats, DPM

## 2023-07-02 ENCOUNTER — Other Ambulatory Visit (HOSPITAL_COMMUNITY): Payer: Self-pay | Admitting: Sports Medicine

## 2023-07-02 DIAGNOSIS — M25512 Pain in left shoulder: Secondary | ICD-10-CM

## 2023-07-06 ENCOUNTER — Ambulatory Visit (HOSPITAL_COMMUNITY)
Admission: RE | Admit: 2023-07-06 | Discharge: 2023-07-06 | Disposition: A | Discharge status: Home / Self Care | Source: Ambulatory Visit | Attending: Sports Medicine | Admitting: Sports Medicine

## 2023-07-06 DIAGNOSIS — M25512 Pain in left shoulder: Secondary | ICD-10-CM | POA: Insufficient documentation

## 2023-11-16 ENCOUNTER — Other Ambulatory Visit: Payer: Self-pay | Admitting: Family Medicine

## 2023-11-16 DIAGNOSIS — L0390 Cellulitis, unspecified: Secondary | ICD-10-CM

## 2023-11-18 ENCOUNTER — Ambulatory Visit
Admission: RE | Admit: 2023-11-18 | Discharge: 2023-11-18 | Disposition: A | Discharge status: Home / Self Care | Source: Ambulatory Visit | Attending: Family Medicine | Admitting: Family Medicine

## 2023-11-18 DIAGNOSIS — L0390 Cellulitis, unspecified: Secondary | ICD-10-CM

## 2023-11-20 ENCOUNTER — Other Ambulatory Visit: Payer: Self-pay | Admitting: Family Medicine

## 2023-11-20 DIAGNOSIS — L0390 Cellulitis, unspecified: Secondary | ICD-10-CM

## 2023-11-25 ENCOUNTER — Other Ambulatory Visit

## 2023-12-02 ENCOUNTER — Other Ambulatory Visit

## 2023-12-21 ENCOUNTER — Ambulatory Visit
Admission: RE | Admit: 2023-12-21 | Discharge: 2023-12-21 | Disposition: A | Source: Ambulatory Visit | Attending: Family Medicine

## 2023-12-21 DIAGNOSIS — L0390 Cellulitis, unspecified: Secondary | ICD-10-CM
